# Patient Record
Sex: Female | Born: 1990 | Race: White | Hispanic: No | Marital: Married | Smoking: Never smoker
Health system: Southern US, Community
[De-identification: ages and names within clinical notes are randomized; demographics above are authoritative.]

## PROBLEM LIST (undated history)

## (undated) DIAGNOSIS — T7840XA Allergy, unspecified, initial encounter: Secondary | ICD-10-CM

## (undated) DIAGNOSIS — F419 Anxiety disorder, unspecified: Secondary | ICD-10-CM

## (undated) DIAGNOSIS — J45909 Unspecified asthma, uncomplicated: Secondary | ICD-10-CM

## (undated) HISTORY — DX: Unspecified asthma, uncomplicated: J45.909

## (undated) HISTORY — DX: Allergy, unspecified, initial encounter: T78.40XA

## (undated) HISTORY — DX: Anxiety disorder, unspecified: F41.9

---
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
## 2013-10-04 ENCOUNTER — Ambulatory Visit (INDEPENDENT_AMBULATORY_CARE_PROVIDER_SITE_OTHER): Payer: BC Managed Care – PPO | Admitting: Internal Medicine

## 2013-10-04 VITALS — BP 120/72 | HR 69 | Temp 98.1°F | Resp 18 | Ht 65.0 in | Wt 176.0 lb

## 2013-10-04 DIAGNOSIS — Z6829 Body mass index (BMI) 29.0-29.9, adult: Secondary | ICD-10-CM

## 2013-10-04 DIAGNOSIS — M549 Dorsalgia, unspecified: Secondary | ICD-10-CM

## 2013-10-04 LAB — POCT URINALYSIS DIPSTICK
Bilirubin, UA: NEGATIVE
Ketones, UA: NEGATIVE
Leukocytes, UA: NEGATIVE
Protein, UA: NEGATIVE
pH, UA: 7

## 2013-10-04 LAB — POCT UA - MICROSCOPIC ONLY
Crystals, Ur, HPF, POC: NEGATIVE
Mucus, UA: NEGATIVE
Yeast, UA: NEGATIVE

## 2013-10-04 MED ORDER — CYCLOBENZAPRINE HCL 10 MG PO TABS: 10.0000 mg | ORAL_TABLET | Freq: Every day | ORAL | Status: DC

## 2013-10-04 MED ORDER — MELOXICAM 15 MG PO TABS: 15.0000 mg | ORAL_TABLET | Freq: Every day | ORAL | Status: DC

## 2013-10-04 NOTE — Progress Notes (Addendum)
This chart was scribed for Tonye Pearson, MD by Caryn Bee, Medical Scribe. This patient was seen in Room/bed 14 and the patient's care was started at 11:48 AM.  Subjective:    Patient ID: Patricia Phelps, female    DOB: June 06, 1991, 22 y.o.   MRN: [id number]  HPI HPI Comments: Patricia Phelps is a 22 y.o. female who presents to [facility] complaining of sudden onset, moderate left sided back pain that began 09/28/13. Pt reports that she has pain when bending over and crossing her legs. The pain occasionally radiates to her left side. She reports waking multiple times during the night because of pain. She denies dysuria, increased frequency, hematuria, abdominal pain, constipation, diarrhea, nausea. Pt denies numbness or weakness in her left foot. Pt also denies recent injury to the area. She does not play any sports. 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 Pt's sister has h/o kidney stones. Pt takes Prilosec for indigestion for the past 5-7 years. Her LNMP was 09/29/13.    Review of Systems  Gastrointestinal: Negative for nausea, abdominal pain, diarrhea and constipation.  Genitourinary: Negative for dysuria, urgency, frequency and hematuria.  Musculoskeletal: Positive for back pain.  Neurological: Negative for weakness and numbness.   Past Medical History  Diagnosis Date   Allergy    Asthma    Anxiety    History   Social History   Marital Status: Married    Spouse Name: N/A    Number of Children: N/A   Years of Education: N/A   Occupational History   Not on file.   Social History Main Topics   Smoking status: Never Smoker    Smokeless tobacco: Not on file   Alcohol Use: Not on file   Drug Use: Not on file   Sexual Activity: Not on file   Other Topics Concern   Not on file   Social History Narrative   No narrative on file   History reviewed. No pertinent past surgical history. History reviewed. No pertinent family history. No Known Allergies  Results for orders placed  in visit on 10/04/13  POCT UA - MICROSCOPIC ONLY      Result Value Range   WBC, Ur, HPF, POC 0-1     RBC, urine, microscopic 0-1     Bacteria, U Microscopic trace     Mucus, UA neg     Epithelial cells, urine per micros 3-5     Crystals, Ur, HPF, POC neg     Casts, Ur, LPF, POC neg     Yeast, UA neg    POCT URINALYSIS DIPSTICK      Result Value Range   Color, UA yellow     Clarity, UA clear     Glucose, UA neg     Bilirubin, UA neg     Ketones, UA neg     Spec Grav, UA 1.025     Blood, UA trace-intact     pH, UA 7.0     Protein, UA neg     Urobilinogen, UA 0.2     Nitrite, UA neg     Leukocytes, UA Negative        Objective:   Physical Exam  Nursing note and vitals reviewed. Constitutional: She appears well-developed and well-nourished. No distress.  Cardiovascular: Normal rate.   Pulmonary/Chest: Effort normal.  Musculoskeletal: She exhibits tenderness.  Tender in left flank to ROM and to percussion. Straight leg raise negative to 90 degrees bilaterally.  Neurological: She displays normal reflexes.  Skin:  She is not diaphoretic.          Assessment & Plan:  Back pain -musculoskeletal  BMI 29.0-29.9,adult  Meds ordered this encounter  Medications          cyclobenzaprine (FLEXERIL) 10 MG tablet    Sig: Take 1 tablet (10 mg total) by mouth at bedtime. For muscle relaxation    Dispense:  30 tablet    Refill:  0   meloxicam (MOBIC) 15 MG tablet    Sig: Take 1 tablet (15 mg total) by mouth daily.    Dispense:  30 tablet    Refill:  0   Exercises given/followup in 3 weeks

## 2013-10-05 DIAGNOSIS — Z6829 Body mass index (BMI) 29.0-29.9, adult: Secondary | ICD-10-CM | POA: Insufficient documentation

## 2013-12-04 ENCOUNTER — Encounter (HOSPITAL_COMMUNITY): Payer: Self-pay | Admitting: Emergency Medicine

## 2013-12-04 ENCOUNTER — Emergency Department (INDEPENDENT_AMBULATORY_CARE_PROVIDER_SITE_OTHER)
Admission: EM | Admit: 2013-12-04 | Discharge: 2013-12-04 | Disposition: A | Discharge status: Home / Self Care | Payer: BC Managed Care – PPO | Source: Home / Self Care | Attending: Emergency Medicine | Admitting: Emergency Medicine

## 2013-12-04 DIAGNOSIS — J45909 Unspecified asthma, uncomplicated: Secondary | ICD-10-CM

## 2013-12-04 DIAGNOSIS — J111 Influenza due to unidentified influenza virus with other respiratory manifestations: Secondary | ICD-10-CM

## 2013-12-04 MED ORDER — PREDNISONE 10 MG PO TABS: ORAL_TABLET | ORAL | Status: DC

## 2013-12-04 MED ORDER — ALBUTEROL SULFATE HFA 108 (90 BASE) MCG/ACT IN AERS
INHALATION_SPRAY | RESPIRATORY_TRACT | Status: AC
  Filled 2013-12-04: qty 6.7

## 2013-12-04 MED ORDER — ALBUTEROL SULFATE HFA 108 (90 BASE) MCG/ACT IN AERS
2.0000 | INHALATION_SPRAY | RESPIRATORY_TRACT | Status: DC
  Administered 2013-12-04: 2 via RESPIRATORY_TRACT

## 2013-12-04 MED ORDER — OSELTAMIVIR PHOSPHATE 75 MG PO CAPS: 75.0000 mg | ORAL_CAPSULE | Freq: Two times a day (BID) | ORAL | Status: DC

## 2013-12-04 MED ORDER — ALBUTEROL SULFATE HFA 108 (90 BASE) MCG/ACT IN AERS: 1.0000 | INHALATION_SPRAY | Freq: Four times a day (QID) | RESPIRATORY_TRACT | Status: DC | PRN

## 2013-12-04 NOTE — ED Notes (Signed)
Pt  Reports  Symptoms  Of  Congested  Cough   And     Fever      Since  Yesterday        -    The   Patient        Has  History  Of  Asthma   Her inhalers  Are  Empty                 She  Has  A  Low  Grade  Fever       She  Did  Not take  Her flu  Shot this  Year

## 2013-12-04 NOTE — ED Provider Notes (Signed)
CSN: [id number]     Arrival date & time 12/04/13  1806 History   First MD Initiated Contact with Patient 12/04/13 1857     Chief Complaint  Patient presents with  . URI   (Consider location/radiation/quality/duration/timing/severity/associated sxs/prior Treatment) Patient is a 22 y.o. female presenting with URI. The history is provided by the patient. No language interpreter was used.  URI Presenting symptoms: congestion, cough and fever   Severity:  Moderate Onset quality:  Gradual Timing:  Constant Progression:  Worsening Chronicity:  New Relieved by:  Nothing Worsened by:  Nothing tried Ineffective treatments:  None tried Risk factors: no sick contacts     Past Medical History  Diagnosis Date  . Allergy   . Asthma   . Anxiety    History reviewed. No pertinent past surgical history. History reviewed. No pertinent family history. History  Substance Use Topics  . Smoking status: Never Smoker   . Smokeless tobacco: Not on file  . Alcohol Use: Yes   OB History   Grav Para Term Preterm Abortions TAB SAB Ect Mult Living                 Review of Systems  Constitutional: Positive for fever.  HENT: Positive for congestion.   Respiratory: Positive for cough.   All other systems reviewed and are negative.    Allergies  Review of patient's allergies indicates no known allergies.  Home Medications   Current Outpatient Rx  Name  Route  Sig  Dispense  Refill  . albuterol (PROVENTIL HFA;VENTOLIN HFA) 108 (90 BASE) MCG/ACT inhaler   Inhalation   Inhale 1-2 puffs into the lungs every 6 (six) hours as needed for wheezing or shortness of breath.   1 Inhaler   0   . cyclobenzaprine (FLEXERIL) 10 MG tablet   Oral   Take 1 tablet (10 mg total) by mouth at bedtime. For muscle relaxation   30 tablet   0   . meloxicam (MOBIC) 15 MG tablet   Oral   Take 1 tablet (15 mg total) by mouth daily.   30 tablet   0   . omeprazole (PRILOSEC OTC) 20 MG tablet   Oral   Take  20 mg by mouth daily.         Marland Kitchen oseltamivir (TAMIFLU) 75 MG capsule   Oral   Take 1 capsule (75 mg total) by mouth every 12 (twelve) hours.   10 capsule   0   . predniSONE (DELTASONE) 10 MG tablet      6,5,4,3,2,1 taper   21 tablet   0    BP 124/84  Pulse 106  Temp(Src) 100.2 F (37.9 C) (Oral)  Resp 19  SpO2 95%  LMP 11/30/2013 Physical Exam  Nursing note and vitals reviewed. Constitutional: She is oriented to person, place, and time. She appears well-developed and well-nourished.  HENT:  Head: Normocephalic.  Right Ear: External ear normal.  Left Ear: External ear normal.  Eyes: Conjunctivae and EOM are normal. Pupils are equal, round, and reactive to light.  Neck: Normal range of motion.  Pulmonary/Chest: Effort normal. She has wheezes.  Abdominal: Soft. She exhibits no distension.  Musculoskeletal: Normal range of motion.  Neurological: She is alert and oriented to person, place, and time.  Skin: Skin is warm.  Psychiatric: She has a normal mood and affect.    ED Course  Procedures (including critical care time) Labs Review Labs Reviewed - No data to display Imaging Review No results found.  EKG Interpretation    Date/Time:    Ventricular Rate:    PR Interval:    QRS Duration:   QT Interval:    QTC Calculation:   R Axis:     Text Interpretation:              MDM   1. Influenza   2. Asthma    Pt given albuterol 2 puffs here.   Rx for tamiflu, albuterol and prednisone taper,      Elson Areas, PA-C 12/04/13 2001  Lonia Skinner [place], [place] 12/04/13 2002

## 2013-12-05 NOTE — ED Provider Notes (Signed)
Medical screening examination/treatment/procedure(s) were performed by a resident physician or non-physician practitioner and as the supervising physician I was immediately available for consultation/collaboration.  Evan Corey, MD    Evan S Corey, MD 12/05/13 0740 

## 2014-11-03 ENCOUNTER — Encounter (HOSPITAL_COMMUNITY): Payer: Self-pay | Admitting: *Deleted

## 2014-11-03 ENCOUNTER — Emergency Department (HOSPITAL_COMMUNITY)
Admission: EM | Admit: 2014-11-03 | Discharge: 2014-11-03 | Disposition: A | Discharge status: Home / Self Care | Payer: BC Managed Care – PPO | Attending: Emergency Medicine | Admitting: Emergency Medicine

## 2014-11-03 DIAGNOSIS — Z791 Long term (current) use of non-steroidal anti-inflammatories (NSAID): Secondary | ICD-10-CM | POA: Insufficient documentation

## 2014-11-03 DIAGNOSIS — Z8659 Personal history of other mental and behavioral disorders: Secondary | ICD-10-CM | POA: Insufficient documentation

## 2014-11-03 DIAGNOSIS — J45901 Unspecified asthma with (acute) exacerbation: Secondary | ICD-10-CM | POA: Insufficient documentation

## 2014-11-03 DIAGNOSIS — J029 Acute pharyngitis, unspecified: Secondary | ICD-10-CM | POA: Insufficient documentation

## 2014-11-03 DIAGNOSIS — Z79899 Other long term (current) drug therapy: Secondary | ICD-10-CM | POA: Insufficient documentation

## 2014-11-03 MED ORDER — ALBUTEROL SULFATE HFA 108 (90 BASE) MCG/ACT IN AERS
2.0000 | INHALATION_SPRAY | Freq: Once | RESPIRATORY_TRACT | Status: AC
  Administered 2014-11-03: 2 via RESPIRATORY_TRACT
  Filled 2014-11-03: qty 6.7

## 2014-11-03 MED ORDER — IPRATROPIUM-ALBUTEROL 0.5-2.5 (3) MG/3ML IN SOLN
3.0000 mL | Freq: Once | RESPIRATORY_TRACT | Status: AC
  Administered 2014-11-03: 3 mL via RESPIRATORY_TRACT
  Filled 2014-11-03: qty 3

## 2014-11-03 NOTE — ED Notes (Signed)
Pt c/o asthma attack starting 4 hours ago. Pt had an inhaler; however cannot afford to refill prescription for inhaler. Has been using OTC medications, steam, and deep breathing exercises without relief. Expiratory wheezes noted

## 2014-11-03 NOTE — ED Provider Notes (Signed)
CSN: [id number]     Arrival date & time 11/03/14  2216 History  This chart was scribed for non-physician practitioner working with Flint MelterElliott L Wentz, MD by Elveria Risingimelie Horne, ED Scribe. This patient was seen in room TR09C/TR09C and the patient's care was started at 10:47 PM.   Chief Complaint  Patient presents with  . Asthma   The history is provided by the patient. No language interpreter was used.   HPI Comments: Patricia Phelps is a 23 y.o. female with history of asthma who presents to the Emergency Department complaining of intermittent asthma exacerbation for one month now. Patient reports exhausting inhaler four months ago. Patient reports sensation that she is suffocating with lying down. Patient reports historical treatment with albuterol and Qvar which proved effective. 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 Patient denies recent cold symptoms. No previous admissions for asthma.   Past Medical History  Diagnosis Date  . Allergy   . Asthma   . Anxiety    History reviewed. No pertinent past surgical history. History reviewed. No pertinent family history. History  Substance Use Topics  . Smoking status: Never Smoker   . Smokeless tobacco: Not on file  . Alcohol Use: Yes   OB History    No data available     Review of Systems  Constitutional: Negative for fever, chills and fatigue.  HENT: Positive for sore throat. Negative for congestion, ear pain, rhinorrhea and sinus pressure.   Eyes: Negative for redness.  Respiratory: Positive for cough and wheezing.   Gastrointestinal: Negative for nausea, vomiting, abdominal pain and diarrhea.  Genitourinary: Negative for dysuria.  Musculoskeletal: Negative for myalgias and neck stiffness.  Skin: Negative for rash.  Neurological: Negative for headaches.  Hematological: Negative for adenopathy.      Allergies  Review of patient's allergies indicates no known  allergies.  Home Medications   Prior to Admission medications   Medication Sig Start Date End Date Taking? Authorizing Provider  albuterol (PROVENTIL HFA;VENTOLIN HFA) 108 (90 BASE) MCG/ACT inhaler Inhale 1-2 puffs into the lungs every 6 (six) hours as needed for wheezing or shortness of breath. 12/04/13   Elson AreasLeslie K Sofia, PA-C  cyclobenzaprine (FLEXERIL) 10 MG tablet Take 1 tablet (10 mg total) by mouth at bedtime. For muscle relaxation 10/04/13   Tonye Pearsonobert P Doolittle, MD  meloxicam (MOBIC) 15 MG tablet Take 1 tablet (15 mg total) by mouth daily. 10/04/13   Tonye Pearsonobert P Doolittle, MD  omeprazole (PRILOSEC OTC) 20 MG tablet Take 20 mg by mouth daily.    Historical Provider, MD  oseltamivir (TAMIFLU) 75 MG capsule Take 1 capsule (75 mg total) by mouth every 12 (twelve) hours. 12/04/13   Elson AreasLeslie K Sofia, PA-C  predniSONE (DELTASONE) 10 MG tablet 6,5,4,3,2,1 taper 12/04/13   Elson AreasLeslie K Sofia, PA-C   Triage Vitals: BP 120/75 mmHg  Pulse 89  Temp(Src) 98.3 F (36.8 C) (Oral)  Resp 22  SpO2 99% Physical Exam  Constitutional: She is oriented to person, place, and time. She appears well-developed and well-nourished. No distress.  HENT:  Head: Normocephalic and atraumatic.  Right Ear: Tympanic membrane, external ear and ear canal normal.  Left Ear: Tympanic membrane, external ear and ear canal normal.  Nose: Nose normal. No mucosal edema or rhinorrhea.  Mouth/Throat: Uvula is midline, oropharynx is clear and moist and mucous membranes are normal. Mucous membranes are not dry. No oral lesions. No trismus in the jaw. No uvula swelling.  No oropharyngeal exudate, posterior oropharyngeal edema, posterior oropharyngeal erythema or tonsillar abscesses.  Eyes: Conjunctivae and EOM are normal. Right eye exhibits no discharge. Left eye exhibits no discharge.  Neck: Normal range of motion. Neck supple. No tracheal deviation present.  Cardiovascular: Normal rate, regular rhythm and normal heart sounds.    Pulmonary/Chest: Effort normal and breath sounds normal. No respiratory distress. She has no wheezes. She has no rales.  No wheezing s/p breathing treatment.   Abdominal: Soft. There is no tenderness.  Musculoskeletal: Normal range of motion.  Lymphadenopathy:    She has no cervical adenopathy.  Neurological: She is alert and oriented to person, place, and time.  Skin: Skin is warm and dry.  Psychiatric: She has a normal mood and affect. Her behavior is normal.  Nursing note and vitals reviewed.   ED Course  Procedures (including critical care time)  COORDINATION OF CARE: 10:52 PM- Discussed treatment plan with patient at bedside and patient agreed to plan.   Labs Review Labs Reviewed - No data to display  Imaging Review No results found.   EKG Interpretation None      Vital signs reviewed and are as follows: Filed Vitals:   11/03/14 2220  BP: 120/75  Pulse: 89  Temp: 98.3 F (36.8 C)  Resp: 22   Patient with subjective and objective improvement after breathing treatment. Will discharge to home with albuterol inhaler. Offered prednisone, patient does not think that she needs this. No recent URI symptoms, fever or other concerns. Discharged home.  MDM   Final diagnoses:  Asthma attack   Patient with poorly controlled asthma due to medication noncompliance. Patient much improved after one breathing treatment here. No underlying URI. Do not suspect pneumonia. Patient appears well, nontoxic.  I personally performed the services described in this documentation, which was scribed in my presence. The recorded information has been reviewed and is accurate.    Renne CriglerJoshua Poseidon Pam, PA-C 11/03/14 2300  Flint MelterElliott L Wentz, MD 11/03/14 [phone]

## 2014-11-03 NOTE — ED Notes (Signed)
Pt in c/o asthma attack, states she ran out of her inhaler a few months ago but hadn't needed it, tonight started wheezing and coughing, pt speaking in full sentences, audible wheezing noted

## 2014-11-03 NOTE — Discharge Instructions (Signed)
Please read and follow all provided instructions.  Your diagnoses today include:  1. Asthma attack    Tests performed today include:  Vital signs. See below for your results today.   Medications prescribed:   Albuterol inhaler - medication that opens up your airway  Use inhaler as follows: 1-2 puffs with spacer every 4 hours as needed for wheezing, cough, or shortness of breath.   Take any prescribed medications only as directed.  Home care instructions:  Follow any educational materials contained in this packet.  Follow-up instructions: Please follow-up with your primary care provider in the next 3 days for further evaluation of your symptoms and management of your asthma.  Return instructions:   Please return to the Emergency Department if you experience worsening symptoms.  Please return with worsening wheezing, shortness of breath, or difficulty breathing.  Return with persistent fever above 101F.   Please return if you have any other emergent concerns.  Additional Information:  Your vital signs today were: BP 120/75 mmHg   Pulse 89   Temp(Src) 98.3 F (36.8 C) (Oral)   Resp 22   SpO2 99% If your blood pressure (BP) was elevated above 135/85 this visit, please have this repeated by your doctor within one month. --------------

## 2015-12-25 ENCOUNTER — Emergency Department (HOSPITAL_COMMUNITY)
Admission: EM | Admit: 2015-12-25 | Discharge: 2015-12-25 | Disposition: A | Discharge status: Home / Self Care | Payer: Self-pay | Attending: Emergency Medicine | Admitting: Emergency Medicine

## 2015-12-25 ENCOUNTER — Encounter (HOSPITAL_COMMUNITY): Payer: Self-pay | Admitting: Emergency Medicine

## 2015-12-25 ENCOUNTER — Emergency Department (HOSPITAL_COMMUNITY): Payer: Self-pay

## 2015-12-25 DIAGNOSIS — Z79899 Other long term (current) drug therapy: Secondary | ICD-10-CM | POA: Insufficient documentation

## 2015-12-25 DIAGNOSIS — J209 Acute bronchitis, unspecified: Secondary | ICD-10-CM

## 2015-12-25 DIAGNOSIS — Z8659 Personal history of other mental and behavioral disorders: Secondary | ICD-10-CM | POA: Insufficient documentation

## 2015-12-25 DIAGNOSIS — J45901 Unspecified asthma with (acute) exacerbation: Secondary | ICD-10-CM | POA: Insufficient documentation

## 2015-12-25 DIAGNOSIS — H7493 Unspecified disorder of middle ear and mastoid, bilateral: Secondary | ICD-10-CM | POA: Insufficient documentation

## 2015-12-25 DIAGNOSIS — Z791 Long term (current) use of non-steroidal anti-inflammatories (NSAID): Secondary | ICD-10-CM | POA: Insufficient documentation

## 2015-12-25 LAB — RAPID STREP SCREEN (MED CTR MEBANE ONLY): Streptococcus, Group A Screen (Direct): NEGATIVE

## 2015-12-25 MED ORDER — BENZONATATE 100 MG PO CAPS: 100.0000 mg | ORAL_CAPSULE | Freq: Three times a day (TID) | ORAL | Status: DC | PRN

## 2015-12-25 MED ORDER — CETIRIZINE HCL 10 MG PO TABS: 10.0000 mg | ORAL_TABLET | Freq: Every day | ORAL | Status: AC

## 2015-12-25 MED ORDER — FLUTICASONE PROPIONATE 50 MCG/ACT NA SUSP: 2.0000 | Freq: Every day | NASAL | Status: AC

## 2015-12-25 MED ORDER — NAPROXEN 250 MG PO TABS: 250.0000 mg | ORAL_TABLET | Freq: Two times a day (BID) | ORAL | Status: DC

## 2015-12-25 MED ORDER — ALBUTEROL SULFATE HFA 108 (90 BASE) MCG/ACT IN AERS: 2.0000 | INHALATION_SPRAY | RESPIRATORY_TRACT | Status: AC | PRN

## 2015-12-25 MED ORDER — ALBUTEROL SULFATE HFA 108 (90 BASE) MCG/ACT IN AERS
2.0000 | INHALATION_SPRAY | Freq: Once | RESPIRATORY_TRACT | Status: AC
  Administered 2015-12-25: 2 via RESPIRATORY_TRACT
  Filled 2015-12-25: qty 6.7

## 2015-12-25 MED ORDER — IPRATROPIUM-ALBUTEROL 0.5-2.5 (3) MG/3ML IN SOLN
3.0000 mL | Freq: Once | RESPIRATORY_TRACT | Status: AC
  Administered 2015-12-25: 3 mL via RESPIRATORY_TRACT
  Filled 2015-12-25: qty 3

## 2015-12-25 MED ORDER — PREDNISONE 20 MG PO TABS: 40.0000 mg | ORAL_TABLET | Freq: Every day | ORAL | Status: DC

## 2015-12-25 NOTE — Discharge Instructions (Signed)

## 2015-12-25 NOTE — ED Provider Notes (Signed)
CSN: [id number]     Arrival date & time 12/25/15  1106 History  By signing my name below, I, Freida Busman, attest that this documentation has been prepared under the direction and in the presence of non-physician practitioner, Everlene Farrier, PA-C. Electronically Signed: Freida Busman, Scribe. 12/25/2015. 1:17 PM.    Chief Complaint  Patient presents with  . Cough  . Nasal Congestion    The history is provided by the patient. No language interpreter was used.     HPI Comments:  Patricia Phelps is a 25 y.o. female with a history of asthma, who presents to the Emergency Department complaining of productive cough with green sputum X 1 week. She reports associated wheezing, chest tightness, ear pain and sore throat, chest tightness. Pt notes subjective fever 2 days ago, none today. She has taken tylenol with mild relief of pain, last dose was ~ 0600 this AM. She has also used her inhaler but she is almost out and in need of a refill.  She denies CP, rashes, nausea, vomiting and diarrhea. No alleviating factors noted.    Past Medical History  Diagnosis Date  . Allergy   . Asthma   . Anxiety    History reviewed. No pertinent past surgical history. History reviewed. No pertinent family history. Social History  Substance Use Topics  . Smoking status: Never Smoker   . Smokeless tobacco: None  . Alcohol Use: Yes   OB History    No data available     Review of Systems  Constitutional: Positive for fever (subjective). Negative for chills.  HENT: Positive for ear pain, postnasal drip, rhinorrhea, sneezing and sore throat. Negative for trouble swallowing and voice change.   Eyes: Negative for visual disturbance.  Respiratory: Positive for cough, chest tightness and wheezing. Negative for shortness of breath.   Cardiovascular: Negative for chest pain.  Gastrointestinal: Negative for nausea, vomiting and abdominal pain.  Musculoskeletal: Positive for myalgias (chest and neck soreness).   Skin: Negative for rash and wound.  Neurological: Negative for dizziness, light-headedness and headaches.    Allergies  Review of patient's allergies indicates no known allergies.  Home Medications   Prior to Admission medications   Medication Sig Start Date End Date Taking? Authorizing Provider  albuterol (PROVENTIL HFA;VENTOLIN HFA) 108 (90 Base) MCG/ACT inhaler Inhale 2 puffs into the lungs every 4 (four) hours as needed for wheezing or shortness of breath. 12/25/15   Everlene Farrier, PA-C  benzonatate (TESSALON) 100 MG capsule Take 1 capsule (100 mg total) by mouth 3 (three) times daily as needed for cough. 12/25/15   Everlene Farrier, PA-C  cetirizine (ZYRTEC ALLERGY) 10 MG tablet Take 1 tablet (10 mg total) by mouth daily. 12/25/15   Everlene Farrier, PA-C  cyclobenzaprine (FLEXERIL) 10 MG tablet Take 1 tablet (10 mg total) by mouth at bedtime. For muscle relaxation 10/04/13   Tonye Pearson, MD  fluticasone [facility]) 50 MCG/ACT nasal spray Place 2 sprays into both nostrils daily. 12/25/15   Everlene Farrier, PA-C  meloxicam (MOBIC) 15 MG tablet Take 1 tablet (15 mg total) by mouth daily. 10/04/13   Tonye Pearson, MD  naproxen (NAPROSYN) 250 MG tablet Take 1 tablet (250 mg total) by mouth 2 (two) times daily with a meal. 12/25/15   Everlene Farrier, PA-C  omeprazole (PRILOSEC OTC) 20 MG tablet Take 20 mg by mouth daily.    Historical Provider, MD  oseltamivir (TAMIFLU) 75 MG capsule Take 1 capsule (75 mg total) by mouth every 12 (twelve)  hours. 12/04/13   Elson Areas, PA-C  predniSONE (DELTASONE) 20 MG tablet Take 2 tablets (40 mg total) by mouth daily. 12/25/15   Everlene Farrier, PA-C   BP 119/77 mmHg  Pulse 73  Temp(Src) 98.9 F (37.2 C) (Oral)  Resp 18  SpO2 98% Physical Exam  Constitutional: She is oriented to person, place, and time. She appears well-developed and well-nourished. No distress.  Nontoxic appearing.  HENT:  Head: Normocephalic and atraumatic.  Right Ear:  External ear normal. Tympanic membrane is not erythematous. A middle ear effusion is present.  Left Ear: External ear normal. Tympanic membrane is not erythematous. A middle ear effusion is present.  Mouth/Throat: Uvula is midline. Posterior oropharyngeal erythema present. No oropharyngeal exudate.  Mild middle ear effusion bilaterally; no erythema Boggy nasal turbinates  Mild tonsillar hypertrophy; no exudates. Uvula is midline without edema. No trismus. No drooling. No peritonsillar abscess.  Eyes: Conjunctivae are normal. Pupils are equal, round, and reactive to light. Right eye exhibits no discharge. Left eye exhibits no discharge.  Neck: Normal range of motion. Neck supple. No JVD present. No tracheal deviation present.  Cardiovascular: Normal rate, regular rhythm, normal heart sounds and intact distal pulses.  Exam reveals no gallop and no friction rub.   No murmur heard. Pulmonary/Chest: Effort normal. No respiratory distress. She has wheezes. She has no rales. She exhibits no tenderness.  scattered wheezes bilaterally.  Abdominal: Soft. Bowel sounds are normal. There is no tenderness. There is no guarding.  Musculoskeletal: She exhibits no edema.  Lymphadenopathy:    She has no cervical adenopathy.  Neurological: She is alert and oriented to person, place, and time. Coordination normal.  Skin: Skin is warm and dry. No rash noted. She is not diaphoretic. No erythema. No pallor.  Psychiatric: She has a normal mood and affect. Her behavior is normal.  Nursing note and vitals reviewed.   ED Course  Procedures   DIAGNOSTIC STUDIES:  Oxygen Saturation is 98% on RA, normal by my interpretation.    COORDINATION OF CARE:  12:40 PM Will order rapid strep and CXR. Discussed treatment plan with pt at bedside and pt agreed to plan.  Labs Review Labs Reviewed  RAPID STREP SCREEN (NOT AT [facility])  CULTURE, GROUP A STREP [facility])    Imaging Review Dg Chest 2 View  12/25/2015  CLINICAL  DATA:  Shortness of breath and cough EXAM: CHEST  2 VIEW COMPARISON:  None. FINDINGS: Lungs are clear. Heart size and pulmonary vascularity are normal. No adenopathy. No bone lesions. IMPRESSION: No edema or consolidation. Electronically Signed   By: Bretta Bang III M.D.   On: 12/25/2015 13:55   I have personally reviewed and evaluated these images and lab results as part of my medical decision-making.   EKG Interpretation None      Filed Vitals:   12/25/15 1133  BP: 119/77  Pulse: 73  Temp: 98.9 F (37.2 C)  TempSrc: Oral  Resp: 18  SpO2: 98%     MDM   Meds given in ED:  Medications  albuterol (PROVENTIL HFA;VENTOLIN HFA) 108 (90 Base) MCG/ACT inhaler 2 puff (not administered)  ipratropium-albuterol (DUONEB) 0.5-2.5 (3) MG/3ML nebulizer solution 3 mL (3 mLs Nebulization Given 12/25/15 1319)    New Prescriptions   ALBUTEROL (PROVENTIL HFA;VENTOLIN HFA) 108 (90 BASE) MCG/ACT INHALER    Inhale 2 puffs into the lungs every 4 (four) hours as needed for wheezing or shortness of breath.   BENZONATATE (TESSALON) 100 MG CAPSULE  Take 1 capsule (100 mg total) by mouth 3 (three) times daily as needed for cough.   CETIRIZINE (ZYRTEC ALLERGY) 10 MG TABLET    Take 1 tablet (10 mg total) by mouth daily.   FLUTICASONE (FLONASE) 50 MCG/ACT NASAL SPRAY    Place 2 sprays into both nostrils daily.   NAPROXEN (NAPROSYN) 250 MG TABLET    Take 1 tablet (250 mg total) by mouth 2 (two) times daily with a meal.   PREDNISONE (DELTASONE) 20 MG TABLET    Take 2 tablets (40 mg total) by mouth daily.    Final diagnoses:  Acute bronchitis, unspecified organism   This is a 25 y.o. female with a history of asthma, who presents to the Emergency Department complaining of productive cough with green sputum X 1 week. She reports associated wheezing, chest tightness, ear pain and sore throat, chest tightness. On exam the patient is afebrile nontoxic appearing. She has mild scattered wheezes noted  bilaterally. No increased work of breathing. Oxygen saturation is 98% on room air. She has mild bilateral tonsillar hypertrophy without exudates. Mild middle ear effusion noted bilaterally. Chest x-ray is unremarkable. Rapid strep is negative. Patient with bronchitis. After DuoNeb patient reports feeling much better. We'll discharge with albuterol inhaler. Will send home with prescriptions for Tessalon Perles, Zyrtec, Flonase, naproxen and steroid. I advised the patient to follow-up with their primary care provider this week. I advised the patient to return to the emergency department with new or worsening symptoms or new concerns. The patient verbalized understanding and agreement with plan.    I personally performed the services described in this documentation, which was scribed in my presence. The recorded information has been reviewed and is accurate.       Everlene Farrier, PA-C 12/25/15 1426  Mancel Bale, MD 12/25/15 [phone]

## 2015-12-25 NOTE — ED Notes (Signed)
Pt sts cough and nasal congestion x 1 week with sore throat and ear pain

## 2015-12-27 LAB — CULTURE, GROUP A STREP (THRC)

## 2016-05-16 ENCOUNTER — Ambulatory Visit (INDEPENDENT_AMBULATORY_CARE_PROVIDER_SITE_OTHER): Payer: Worker's Compensation | Admitting: Family Medicine

## 2016-05-16 VITALS — BP 106/68 | HR 75 | Temp 98.9°F | Resp 16 | Ht 65.5 in | Wt 169.0 lb

## 2016-05-16 DIAGNOSIS — T148XXA Other injury of unspecified body region, initial encounter: Secondary | ICD-10-CM

## 2016-05-16 DIAGNOSIS — T148 Other injury of unspecified body region: Secondary | ICD-10-CM

## 2016-05-16 DIAGNOSIS — S060X0A Concussion without loss of consciousness, initial encounter: Secondary | ICD-10-CM

## 2016-05-16 MED ORDER — CYCLOBENZAPRINE HCL 10 MG PO TABS: 10.0000 mg | ORAL_TABLET | Freq: Every day | ORAL | Status: AC

## 2016-05-16 MED ORDER — NAPROXEN 500 MG PO TABS: 500.0000 mg | ORAL_TABLET | Freq: Two times a day (BID) | ORAL | Status: AC

## 2016-05-16 NOTE — Progress Notes (Signed)
Patricia Phelps is a 25 y.o. female who presents to [facility] today for head injury. Patient was at work today and slipped and fell on stairs hitting the posterior aspect of her head. She denies any loss of consciousness but notes headache and felt foggy initially. She also notes some posterior neck pain. She denies any radiating pain weakness or numbness. No bowel bladder dysfunction fevers chills nausea vomiting or diarrhea. She feels well otherwise. She has not tried any medications yet.     Past Medical History  Diagnosis Date  . Allergy   . Asthma   . Anxiety    History reviewed. No pertinent past surgical history. Social History  Substance Use Topics  . Smoking status: Never Smoker   . Smokeless tobacco: Not on file  . Alcohol Use: Yes   ROS as above No , visual changes, nausea, vomiting, diarrhea, constipation, dizziness, abdominal pain, skin rash, fevers, chills, night sweats, weight loss, swollen lymph nodes, body aches, joint swelling, muscle aches, chest pain, shortness of breath, mood changes, visual or auditory hallucinations.   Medications: Current Outpatient Prescriptions  Medication Sig Dispense Refill  . albuterol (PROVENTIL HFA;VENTOLIN HFA) 108 (90 Base) MCG/ACT inhaler Inhale 2 puffs into the lungs every 4 (four) hours as needed for wheezing or shortness of breath. 1 Inhaler 1  . cetirizine (ZYRTEC ALLERGY) 10 MG tablet Take 1 tablet (10 mg total) by mouth daily. 30 tablet 1  . fluticasone (FLONASE) 50 MCG/ACT nasal spray Place 2 sprays into both nostrils daily. 16 g 0  . omeprazole (PRILOSEC OTC) 20 MG tablet Take 20 mg by mouth daily.    . cyclobenzaprine (FLEXERIL) 10 MG tablet Take 1 tablet (10 mg total) by mouth at bedtime. For muscle relaxation 30 tablet 0  . naproxen (NAPROSYN) 500 MG tablet Take 1 tablet (500 mg total) by mouth 2 (two) times daily with a meal. 30 tablet 0  . oseltamivir (TAMIFLU) 75 MG capsule Take 1 capsule (75 mg total) by mouth every 12  (twelve) hours. (Patient not taking: Reported on 05/16/2016) 10 capsule 0  . predniSONE (DELTASONE) 20 MG tablet Take 2 tablets (40 mg total) by mouth daily. (Patient not taking: Reported on 05/16/2016) 10 tablet 0   No current facility-administered medications for this visit.   No Known Allergies   Exam:  BP 106/68 mmHg  Pulse 75  Temp(Src) 98.9 F (37.2 C)  Resp 16  Ht 5' 5.5" (1.664 m)  Wt 169 lb (76.658 kg)  BMI 27.69 kg/m2  SpO2 98%  LMP 04/22/2016 Gen: Well NAD Nontoxic appearing HEENT: EOMI,  MMM Lungs: Normal work of breathing. CTABL Heart: RRR no MRG Abd: NABS, Soft. Nondistended, Nontender Exts: Brisk capillary refill, warm and well perfused.  Neck: Nontender to midline. Tender palpation bilateral cervical paraspinal muscles. Normal neck motion or pain with motion. Negative Spurling's test bilaterally. Upper extremity strength is equal and normal throughout. Reflexes are intact bilateral upper extremities. Pulses intact bilateral upper extremity. Neuro: Alert and oriented normal speech thought process and affect. Normal balance and coordination. Normal memory and attention.  No results found for this or any previous visit (from the past 24 hour(s)). No results found.  Assessment and Plan: 25 y.o. female with head injury due to fall at work. Concern for concussion. Additionally she has a contusion of the posterior spine area and doubtful for fracture or serious intracranial etiology. Plan for watchful waiting with naproxen and Flexeril. Abstain from work and perform cognitive rest. Return to  clinic on Monday for recheck and reevaluation for a return to work assessment.  Discussed warning signs or symptoms. Please see discharge instructions. Patient expresses understanding.

## 2016-05-16 NOTE — Patient Instructions (Addendum)
Thank you for coming in today. Use naproxen twice daily for pain and headache.  Use flexeril mostly at bedtime for muscle spasm.  Use a heating pad.  Return Monday for recheck.  Come back or go to the emergency room if you notice new weakness new numbness problems walking or bowel or bladder problems. Go to the emergency room if your headache becomes excruciating or you have weakness or numbness or uncontrolled vomiting.   Concussion, Adult A concussion, or closed-head injury, is a brain injury caused by a direct blow to the head or by a quick and sudden movement (jolt) of the head or neck. Concussions are usually not life-threatening. Even so, the effects of a concussion can be serious. If you have had a concussion before, you are more likely to experience concussion-like symptoms after a direct blow to the head.  CAUSES  Direct blow to the head, such as from running into another player during a soccer game, being hit in a fight, or hitting your head on a hard surface.  A jolt of the head or neck that causes the brain to move back and forth inside the skull, such as in a car crash. SIGNS AND SYMPTOMS The signs of a concussion can be hard to notice. Early on, they may be missed by you, family members, and health care providers. You may look fine but act or feel differently. Symptoms are usually temporary, but they may last for days, weeks, or even longer. Some symptoms may appear right away while others may not show up for hours or days. Every head injury is different. Symptoms include:  Mild to moderate headaches that will not go away.  A feeling of pressure inside your head.  Having more trouble than usual:  Learning or remembering things you have heard.  Answering questions.  Paying attention or concentrating.  Organizing daily tasks.  Making decisions and solving problems.  Slowness in thinking, acting or reacting, speaking, or reading.  Getting lost or being easily  confused.  Feeling tired all the time or lacking energy (fatigued).  Feeling drowsy.  Sleep disturbances.  Sleeping more than usual.  Sleeping less than usual.  Trouble falling asleep.  Trouble sleeping (insomnia).  Loss of balance or feeling lightheaded or dizzy.  Nausea or vomiting.  Numbness or tingling.  Increased sensitivity to:  Sounds.  Lights.  Distractions.  Vision problems or eyes that tire easily.  Diminished sense of taste or smell.  Ringing in the ears.  Mood changes such as feeling sad or anxious.  Becoming easily irritated or angry for little or no reason.  Lack of motivation.  Seeing or hearing things other people do not see or hear (hallucinations). DIAGNOSIS Your health care provider can usually diagnose a concussion based on a description of your injury and symptoms. He or she will ask whether you passed out (lost consciousness) and whether you are having trouble remembering events that happened right before and during your injury. Your evaluation might include:  A brain scan to look for signs of injury to the brain. Even if the test shows no injury, you may still have a concussion.  Blood tests to be sure other problems are not present. TREATMENT  Concussions are usually treated in an emergency department, in urgent care, or at a clinic. You may need to stay in the hospital overnight for further treatment.  Tell your health care provider if you are taking any medicines, including prescription medicines, over-the-counter medicines, and natural remedies. Some  medicines, such as blood thinners (anticoagulants) and aspirin, may increase the chance of complications. Also tell your health care provider whether you have had alcohol or are taking illegal drugs. This information may affect treatment.  Your health care provider will send you home with important instructions to follow.  How fast you will recover from a concussion depends on many  factors. These factors include how severe your concussion is, what part of your brain was injured, your age, and how healthy you were before the concussion.  Most people with mild injuries recover fully. Recovery can take time. In general, recovery is slower in older persons. Also, persons who have had a concussion in the past or have other medical problems may find that it takes longer to recover from their current injury. HOME CARE INSTRUCTIONS General Instructions  Carefully follow the directions your health care provider gave you.  Only take over-the-counter or prescription medicines for pain, discomfort, or fever as directed by your health care provider.  Take only those medicines that your health care provider has approved.  Do not drink alcohol until your health care provider says you are well enough to do so. Alcohol and certain other drugs may slow your recovery and can put you at risk of further injury.  If it is harder than usual to remember things, write them down.  If you are easily distracted, try to do one thing at a time. For example, do not try to watch TV while fixing dinner.  Talk with family members or close friends when making important decisions.  Keep all follow-up appointments. Repeated evaluation of your symptoms is recommended for your recovery.  Watch your symptoms and tell others to do the same. Complications sometimes occur after a concussion. Older adults with a brain injury may have a higher risk of serious complications, such as a blood clot on the brain.  Tell your teachers, school nurse, school counselor, coach, athletic trainer, or work Production designer, theatre/television/film about your injury, symptoms, and restrictions. Tell them about what you can or cannot do. They should watch for:  Increased problems with attention or concentration.  Increased difficulty remembering or learning new information.  Increased time needed to complete tasks or assignments.  Increased irritability  or decreased ability to cope with stress.  Increased symptoms.  Rest. Rest helps the brain to heal. Make sure you:  Get plenty of sleep at night. Avoid staying up late at night.  Keep the same bedtime hours on weekends and weekdays.  Rest during the day. Take daytime naps or rest breaks when you feel tired.  Limit activities that require a lot of thought or concentration. These include:  Doing homework or job-related work.  Watching TV.  Working on the computer.  Avoid any situation where there is potential for another head injury (football, hockey, soccer, basketball, martial arts, downhill snow sports and horseback riding). Your condition will get worse every time you experience a concussion. You should avoid these activities until you are evaluated by the appropriate follow-up health care providers. Returning To Your Regular Activities You will need to return to your normal activities slowly, not all at once. You must give your body and brain enough time for recovery.  Do not return to sports or other athletic activities until your health care provider tells you it is safe to do so.  Ask your health care provider when you can drive, ride a bicycle, or operate heavy machinery. Your ability to react may be slower after a brain  injury. Never do these activities if you are dizzy.  Ask your health care provider about when you can return to work or school. Preventing Another Concussion It is very important to avoid another brain injury, especially before you have recovered. In rare cases, another injury can lead to permanent brain damage, brain swelling, or death. The risk of this is greatest during the first 7-10 days after a head injury. Avoid injuries by:  Wearing a seat belt when riding in a car.  Drinking alcohol only in moderation.  Wearing a helmet when biking, skiing, skateboarding, skating, or doing similar activities.  Avoiding activities that could lead to a second  concussion, such as contact or recreational sports, until your health care provider says it is okay.  Taking safety measures in your home.  Remove clutter and tripping hazards from floors and stairways.  Use grab bars in bathrooms and handrails by stairs.  Place non-slip mats on floors and in bathtubs.  Improve lighting in dim areas. SEEK MEDICAL CARE IF:  You have increased problems paying attention or concentrating.  You have increased difficulty remembering or learning new information.  You need more time to complete tasks or assignments than before.  You have increased irritability or decreased ability to cope with stress.  You have more symptoms than before. Seek medical care if you have any of the following symptoms for more than 2 weeks after your injury:  Lasting (chronic) headaches.  Dizziness or balance problems.  Nausea.  Vision problems.  Increased sensitivity to noise or light.  Depression or mood swings.  Anxiety or irritability.  Memory problems.  Difficulty concentrating or paying attention.  Sleep problems.  Feeling tired all the time. SEEK IMMEDIATE MEDICAL CARE IF:  You have severe or worsening headaches. These may be a sign of a blood clot in the brain.  You have weakness (even if only in one hand, leg, or part of the face).  You have numbness.  You have decreased coordination.  You vomit repeatedly.  You have increased sleepiness.  One pupil is larger than the other.  You have convulsions.  You have slurred speech.  You have increased confusion. This may be a sign of a blood clot in the brain.  You have increased restlessness, agitation, or irritability.  You are unable to recognize people or places.  You have neck pain.  It is difficult to wake you up.  You have unusual behavior changes.  You lose consciousness. MAKE SURE YOU:  Understand these instructions.  Will watch your condition.  Will get help right away  if you are not doing well or get worse.   This information is not intended to replace advice given to you by your health care provider. Make sure you discuss any questions you have with your health care provider.   Document Released: 02/14/2004 Document Revised: 12/15/2014 Document Reviewed: 06/16/2013 Elsevier Interactive Patient Education 2016 [employer]     IF you received an x-ray today, you will receive an invoice from [facility]. Please contact [facility] at [phone] with questions or concerns regarding your invoice.   IF you received labwork today, you will receive an invoice from [facility]. Please contact [facility] at [phone] with questions or concerns regarding your invoice.   Our billing staff will not be able to assist you with questions regarding bills from these companies.  You will be contacted with the lab results as soon as they are available. The fastest way to get  your results is to activate your My Chart account. Instructions are located on the last page of this paperwork. If you have not heard from [place] regarding the results in 2 weeks, please contact this office.

## 2016-05-19 ENCOUNTER — Ambulatory Visit (INDEPENDENT_AMBULATORY_CARE_PROVIDER_SITE_OTHER): Payer: Worker's Compensation | Admitting: Family Medicine

## 2016-05-19 VITALS — BP 118/78 | HR 89 | Temp 98.3°F | Resp 16 | Ht 65.5 in | Wt 176.6 lb

## 2016-05-19 DIAGNOSIS — M791 Myalgia, unspecified site: Secondary | ICD-10-CM

## 2016-05-19 DIAGNOSIS — S060X0D Concussion without loss of consciousness, subsequent encounter: Secondary | ICD-10-CM

## 2016-05-19 NOTE — Progress Notes (Signed)
Subjective:  By signing my name below, I, Patricia Phelps, attest that this documentation has been prepared under the direction and in the presence of Patricia Staggers, MD.  Electronically Signed: Andrew Phelps, ED Scribe. 05/19/2016. 3:18 PM.   Patient ID: Patricia Phelps, female    DOB: Nov 25, 1991, 25 y.o.   MRN: [id number]  HPI Chief Complaint  Patient presents with  . Follow-up    W/C concussion follow up , headache only when bending or moving head too fast     HPI Comments: Patricia Phelps is a 25 y.o. female who presents to the [facility] follow up from an injury that occurred at work. Pt was seen 3 days ago by Dr. Denyse Amass. She slipped and fell on stairs at work, hitting the poster head on steps. No LOC. Some initial headache and fogginess. Some posterior neck pain, but no midline bony tenderness.  Diagnosed with poss concussion and contusion of spine. Treated with flexeril and naproxen. Out of work for cognitive rest. Here for recheck.   Today, pt reports feeling somewhat "slow with reaction time". No headache at rest but does have headache with certain movements such as bending forward. She notes swelling has resolved at posterior head as well as soreness. Overall she feels 85-90% improved since last visit. No further injuries. She has been resting. Pt has been taking flexeril at night as well as tylenol as needed instead of naproxen prescribed. She denies bladder and bowel incontinence, numbness and weakness of the extremities.   Pt works for [employer].   Patient Active Problem List   Diagnosis Date Noted  . Concussion with no loss of consciousness 05/16/2016  . Contusion 05/16/2016  . BMI 29.0-29.9,adult 10/05/2013   Past Medical History  Diagnosis Date  . Allergy   . Asthma   . Anxiety    No past surgical history on file. No Known Allergies Prior to Admission medications   Medication Sig Start Date End Date Taking? Authorizing Provider  albuterol  (PROVENTIL HFA;VENTOLIN HFA) 108 (90 Base) MCG/ACT inhaler Inhale 2 puffs into the lungs every 4 (four) hours as needed for wheezing or shortness of breath. 12/25/15  Yes Everlene Farrier, PA-C  cetirizine (ZYRTEC ALLERGY) 10 MG tablet Take 1 tablet (10 mg total) by mouth daily. 12/25/15  Yes Everlene Farrier, PA-C  cyclobenzaprine (FLEXERIL) 10 MG tablet Take 1 tablet (10 mg total) by mouth at bedtime. For muscle relaxation 05/16/16  Yes Rodolph Bong, MD  fluticasone [facility]) 50 MCG/ACT nasal spray Place 2 sprays into both nostrils daily. 12/25/15  Yes Everlene Farrier, PA-C  naproxen (NAPROSYN) 500 MG tablet Take 1 tablet (500 mg total) by mouth 2 (two) times daily with a meal. 05/16/16  Yes Rodolph Bong, MD  omeprazole (PRILOSEC OTC) 20 MG tablet Take 20 mg by mouth daily.   Yes Historical Provider, MD   Social History   Social History  . Marital Status: Married    Spouse Name: N/A  . Number of Children: N/A  . Years of Education: N/A   Occupational History  . Not on file.   Social History Main Topics  . Smoking status: Never Smoker   . Smokeless tobacco: Not on file  . Alcohol Use: Yes  . Drug Use: Not on file  . Sexual Activity: Not on file   Other Topics Concern  . Not on file   Social History Narrative   Review of Systems  Gastrointestinal: Negative for diarrhea and constipation.  Genitourinary:  Negative for urgency, enuresis and difficulty urinating.  Neurological: Positive for headaches (with movement). Negative for weakness and numbness.    Objective:   Physical Exam  Constitutional: She is oriented to person, place, and time. She appears well-developed and well-nourished. No distress.  HENT:  Head: Normocephalic and atraumatic.  Eyes: Conjunctivae and EOM are normal. Pupils are equal, round, and reactive to light. Right eye exhibits no nystagmus. Left eye exhibits no nystagmus.  Normal convergence.   Neck: Neck supple. No muscular tenderness present.  Cardiovascular: Normal  rate.   Pulmonary/Chest: Effort normal.  Musculoskeletal: Normal range of motion.  Neurological: She is alert and oriented to person, place, and time.  Slight headache with rapid eye movement. Approximately 7-8 touch down on 1 legged balance testing.   Skin: Skin is warm and dry.  Psychiatric: She has a normal mood and affect. Her behavior is normal.  Nursing note and vitals reviewed.  Filed Vitals:   05/19/16 1431  BP: 118/78  Pulse: 89  Temp: 98.3 F (36.8 C)  TempSrc: Oral  Resp: 16  Height: 5' 5.5" (1.664 m)  Weight: 176 lb 9.6 oz (80.105 kg)  SpO2: 99%    Assessment & Plan:  Patricia Phelps is a 25 y.o. female Concussion with no loss of consciousness, subsequent encounter  Myalgia Concussion, improving. 80-90% improved. Some residual headache with certain activities. Will provide 2 more days out of work for cognitive and physical rest, then anticipate possible trial of return to activity. Tylenol as needed. RTC precautions.    No orders of the defined types were placed in this encounter.   Patient Instructions       IF you received an x-ray today, you will receive an invoice from [facility]. Please contact [facility] at [phone] with questions or concerns regarding your invoice.   IF you received labwork today, you will receive an invoice from [facility]. Please contact [facility] at [phone] with questions or concerns regarding your invoice.   Our billing staff will not be able to assist you with questions regarding bills from these companies.  You will be contacted with the lab results as soon as they are available. The fastest way to get your results is to activate your My Chart account. Instructions are located on the last page of this paperwork. If you have not heard from us regarding the results in 2 weeks, please contact this office.     Continue rest and out of work through Wednesday. Recheck with me on  Wednesday. Also try to avoid electronic media as much as possible in this time. As your other body aches have improved, you can probably stop the muscle relaxant at night, as it also can cause some fatigue. Tylenol or Naprosyn that was discussed last visit is okay for episodic soreness in the body.   Return to the clinic or go to the nearest emergency room if any of your symptoms worsen or new symptoms occur.  Concussion, Adult A concussion, or closed-head injury, is a brain injury caused by a direct blow to the head or by a quick and sudden movement (jolt) of the head or neck. Concussions are usually not life-threatening. Even so, the effects of a concussion can be serious. If you have had a concussion before, you are more likely to experience concussion-like symptoms after a direct blow to the head.  CAUSES  Direct blow to the head, such as from running into another player during a soccer game, being hit  in a fight, or hitting your head on a hard surface.  A jolt of the head or neck that causes the brain to move back and forth inside the skull, such as in a car crash. SIGNS AND SYMPTOMS The signs of a concussion can be hard to notice. Early on, they may be missed by you, family members, and health care providers. You may look fine but act or feel differently. Symptoms are usually temporary, but they may last for days, weeks, or even longer. Some symptoms may appear right away while others may not show up for hours or days. Every head injury is different. Symptoms include:  Mild to moderate headaches that will not go away.  A feeling of pressure inside your head.  Having more trouble than usual:  Learning or remembering things you have heard.  Answering questions.  Paying attention or concentrating.  Organizing daily tasks.  Making decisions and solving problems.  Slowness in thinking, acting or reacting, speaking, or reading.  Getting lost or being easily confused.  Feeling tired  all the time or lacking energy (fatigued).  Feeling drowsy.  Sleep disturbances.  Sleeping more than usual.  Sleeping less than usual.  Trouble falling asleep.  Trouble sleeping (insomnia).  Loss of balance or feeling lightheaded or dizzy.  Nausea or vomiting.  Numbness or tingling.  Increased sensitivity to:  Sounds.  Lights.  Distractions.  Vision problems or eyes that tire easily.  Diminished sense of taste or smell.  Ringing in the ears.  Mood changes such as feeling sad or anxious.  Becoming easily irritated or angry for little or no reason.  Lack of motivation.  Seeing or hearing things other people do not see or hear (hallucinations). DIAGNOSIS Your health care provider can usually diagnose a concussion based on a description of your injury and symptoms. He or she will ask whether you passed out (lost consciousness) and whether you are having trouble remembering events that happened right before and during your injury. Your evaluation might include:  A brain scan to look for signs of injury to the brain. Even if the test shows no injury, you may still have a concussion.  Blood tests to be sure other problems are not present. TREATMENT  Concussions are usually treated in an emergency department, in urgent care, or at a clinic. You may need to stay in the hospital overnight for further treatment.  Tell your health care provider if you are taking any medicines, including prescription medicines, over-the-counter medicines, and natural remedies. Some medicines, such as blood thinners (anticoagulants) and aspirin, may increase the chance of complications. Also tell your health care provider whether you have had alcohol or are taking illegal drugs. This information may affect treatment.  Your health care provider will send you home with important instructions to follow.  How fast you will recover from a concussion depends on many factors. These factors include  how severe your concussion is, what part of your brain was injured, your age, and how healthy you were before the concussion.  Most people with mild injuries recover fully. Recovery can take time. In general, recovery is slower in older persons. Also, persons who have had a concussion in the past or have other medical problems may find that it takes longer to recover from their current injury. HOME CARE INSTRUCTIONS General Instructions  Carefully follow the directions your health care provider gave you.  Only take over-the-counter or prescription medicines for pain, discomfort, or fever as directed by your  health care provider.  Take only those medicines that your health care provider has approved.  Do not drink alcohol until your health care provider says you are well enough to do so. Alcohol and certain other drugs may slow your recovery and can put you at risk of further injury.  If it is harder than usual to remember things, write them down.  If you are easily distracted, try to do one thing at a time. For example, do not try to watch TV while fixing dinner.  Talk with family members or close friends when making important decisions.  Keep all follow-up appointments. Repeated evaluation of your symptoms is recommended for your recovery.  Watch your symptoms and tell others to do the same. Complications sometimes occur after a concussion. Older adults with a brain injury may have a higher risk of serious complications, such as a blood clot on the brain.  Tell your teachers, school nurse, school counselor, coach, athletic trainer, or work Production designer, theatre/television/film about your injury, symptoms, and restrictions. Tell them about what you can or cannot do. They should watch for:  Increased problems with attention or concentration.  Increased difficulty remembering or learning new information.  Increased time needed to complete tasks or assignments.  Increased irritability or decreased ability to cope  with stress.  Increased symptoms.  Rest. Rest helps the brain to heal. Make sure you:  Get plenty of sleep at night. Avoid staying up late at night.  Keep the same bedtime hours on weekends and weekdays.  Rest during the day. Take daytime naps or rest breaks when you feel tired.  Limit activities that require a lot of thought or concentration. These include:  Doing homework or job-related work.  Watching TV.  Working on the computer.  Avoid any situation where there is potential for another head injury (football, hockey, soccer, basketball, martial arts, downhill snow sports and horseback riding). Your condition will get worse every time you experience a concussion. You should avoid these activities until you are evaluated by the appropriate follow-up health care providers. Returning To Your Regular Activities You will need to return to your normal activities slowly, not all at once. You must give your body and brain enough time for recovery.  Do not return to sports or other athletic activities until your health care provider tells you it is safe to do so.  Ask your health care provider when you can drive, ride a bicycle, or operate heavy machinery. Your ability to react may be slower after a brain injury. Never do these activities if you are dizzy.  Ask your health care provider about when you can return to work or school. Preventing Another Concussion It is very important to avoid another brain injury, especially before you have recovered. In rare cases, another injury can lead to permanent brain damage, brain swelling, or death. The risk of this is greatest during the first 7-10 days after a head injury. Avoid injuries by:  Wearing a seat belt when riding in a car.  Drinking alcohol only in moderation.  Wearing a helmet when biking, skiing, skateboarding, skating, or doing similar activities.  Avoiding activities that could lead to a second concussion, such as contact or  recreational sports, until your health care provider says it is okay.  Taking safety measures in your home.  Remove clutter and tripping hazards from floors and stairways.  Use grab bars in bathrooms and handrails by stairs.  Place non-slip mats on floors and in bathtubs.  Improve  lighting in dim areas. SEEK MEDICAL CARE IF:  You have increased problems paying attention or concentrating.  You have increased difficulty remembering or learning new information.  You need more time to complete tasks or assignments than before.  You have increased irritability or decreased ability to cope with stress.  You have more symptoms than before. Seek medical care if you have any of the following symptoms for more than 2 weeks after your injury:  Lasting (chronic) headaches.  Dizziness or balance problems.  Nausea.  Vision problems.  Increased sensitivity to noise or light.  Depression or mood swings.  Anxiety or irritability.  Memory problems.  Difficulty concentrating or paying attention.  Sleep problems.  Feeling tired all the time. SEEK IMMEDIATE MEDICAL CARE IF:  You have severe or worsening headaches. These may be a sign of a blood clot in the brain.  You have weakness (even if only in one hand, leg, or part of the face).  You have numbness.  You have decreased coordination.  You vomit repeatedly.  You have increased sleepiness.  One pupil is larger than the other.  You have convulsions.  You have slurred speech.  You have increased confusion. This may be a sign of a blood clot in the brain.  You have increased restlessness, agitation, or irritability.  You are unable to recognize people or places.  You have neck pain.  It is difficult to wake you up.  You have unusual behavior changes.  You lose consciousness. MAKE SURE YOU:  Understand these instructions.  Will watch your condition.  Will get help right away if you are not doing well or  get worse.   This information is not intended to replace advice given to you by your health care provider. Make sure you discuss any questions you have with your health care provider.   Document Released: 02/14/2004 Document Revised: 12/15/2014 Document Reviewed: 06/16/2013 [employer] Interactive Patient Education [employer]   I personally performed the services described in this documentation, which was scribed in my presence. The recorded information has been reviewed and considered, and addended by me as needed.   Signed,   Patricia Staggers, MD [facility].  05/19/2016 7:39 PM

## 2016-05-19 NOTE — Patient Instructions (Signed)
IF you received an x-ray today, you will receive an invoice from [facility]. Please contact [facility] at [phone] with questions or concerns regarding your invoice.   IF you received labwork today, you will receive an invoice from [facility]. Please contact [facility] at [phone] with questions or concerns regarding your invoice.   Our billing staff will not be able to assist you with questions regarding bills from these companies.  You will be contacted with the lab results as soon as they are available. The fastest way to get your results is to activate your My Chart account. Instructions are located on the last page of this paperwork. If you have not heard from us regarding the results in 2 weeks, please contact this office.     Continue rest and out of work through Wednesday. Recheck with me on Wednesday. Also try to avoid electronic media as much as possible in this time. As your other body aches have improved, you can probably stop the muscle relaxant at night, as it also can cause some fatigue. Tylenol or Naprosyn that was discussed last visit is okay for episodic soreness in the body.   Return to the clinic or go to the nearest emergency room if any of your symptoms worsen or new symptoms occur.  Concussion, Adult A concussion, or closed-head injury, is a brain injury caused by a direct blow to the head or by a quick and sudden movement (jolt) of the head or neck. Concussions are usually not life-threatening. Even so, the effects of a concussion can be serious. If you have had a concussion before, you are more likely to experience concussion-like symptoms after a direct blow to the head.  CAUSES  Direct blow to the head, such as from running into another player during a soccer game, being hit in a fight, or hitting your head on a hard surface.  A jolt of the head or neck that causes the brain to move back and forth inside the  skull, such as in a car crash. SIGNS AND SYMPTOMS The signs of a concussion can be hard to notice. Early on, they may be missed by you, family members, and health care providers. You may look fine but act or feel differently. Symptoms are usually temporary, but they may last for days, weeks, or even longer. Some symptoms may appear right away while others may not show up for hours or days. Every head injury is different. Symptoms include:  Mild to moderate headaches that will not go away.  A feeling of pressure inside your head.  Having more trouble than usual:  Learning or remembering things you have heard.  Answering questions.  Paying attention or concentrating.  Organizing daily tasks.  Making decisions and solving problems.  Slowness in thinking, acting or reacting, speaking, or reading.  Getting lost or being easily confused.  Feeling tired all the time or lacking energy (fatigued).  Feeling drowsy.  Sleep disturbances.  Sleeping more than usual.  Sleeping less than usual.  Trouble falling asleep.  Trouble sleeping (insomnia).  Loss of balance or feeling lightheaded or dizzy.  Nausea or vomiting.  Numbness or tingling.  Increased sensitivity to:  Sounds.  Lights.  Distractions.  Vision problems or eyes that tire easily.  Diminished sense of taste or smell.  Ringing in the ears.  Mood changes such as feeling sad or anxious.  Becoming easily irritated or angry for little or no reason.  Lack of motivation.  Seeing or hearing things other people do not see or hear (hallucinations). DIAGNOSIS Your health care provider can usually diagnose a concussion based on a description of your injury and symptoms. He or she will ask whether you passed out (lost consciousness) and whether you are having trouble remembering events that happened right before and during your injury. Your evaluation might include:  A brain scan to look for signs of injury to the  brain. Even if the test shows no injury, you may still have a concussion.  Blood tests to be sure other problems are not present. TREATMENT  Concussions are usually treated in an emergency department, in urgent care, or at a clinic. You may need to stay in the hospital overnight for further treatment.  Tell your health care provider if you are taking any medicines, including prescription medicines, over-the-counter medicines, and natural remedies. Some medicines, such as blood thinners (anticoagulants) and aspirin, may increase the chance of complications. Also tell your health care provider whether you have had alcohol or are taking illegal drugs. This information may affect treatment.  Your health care provider will send you home with important instructions to follow.  How fast you will recover from a concussion depends on many factors. These factors include how severe your concussion is, what part of your brain was injured, your age, and how healthy you were before the concussion.  Most people with mild injuries recover fully. Recovery can take time. In general, recovery is slower in older persons. Also, persons who have had a concussion in the past or have other medical problems may find that it takes longer to recover from their current injury. HOME CARE INSTRUCTIONS General Instructions  Carefully follow the directions your health care provider gave you.  Only take over-the-counter or prescription medicines for pain, discomfort, or fever as directed by your health care provider.  Take only those medicines that your health care provider has approved.  Do not drink alcohol until your health care provider says you are well enough to do so. Alcohol and certain other drugs may slow your recovery and can put you at risk of further injury.  If it is harder than usual to remember things, write them down.  If you are easily distracted, try to do one thing at a time. For example, do not try to  watch TV while fixing dinner.  Talk with family members or close friends when making important decisions.  Keep all follow-up appointments. Repeated evaluation of your symptoms is recommended for your recovery.  Watch your symptoms and tell others to do the same. Complications sometimes occur after a concussion. Older adults with a brain injury may have a higher risk of serious complications, such as a blood clot on the brain.  Tell your teachers, school nurse, school counselor, coach, athletic trainer, or work Production designer, theatre/television/film about your injury, symptoms, and restrictions. Tell them about what you can or cannot do. They should watch for:  Increased problems with attention or concentration.  Increased difficulty remembering or learning new information.  Increased time needed to complete tasks or assignments.  Increased irritability or decreased ability to cope with stress.  Increased symptoms.  Rest. Rest helps the brain to heal. Make sure you:  Get plenty of sleep at night. Avoid staying up late at night.  Keep the same bedtime hours on weekends and weekdays.  Rest during the day. Take daytime naps or rest breaks when you feel tired.  Limit activities that require a lot of thought  or concentration. These include:  Doing homework or job-related work.  Watching TV.  Working on the computer.  Avoid any situation where there is potential for another head injury (football, hockey, soccer, basketball, martial arts, downhill snow sports and horseback riding). Your condition will get worse every time you experience a concussion. You should avoid these activities until you are evaluated by the appropriate follow-up health care providers. Returning To Your Regular Activities You will need to return to your normal activities slowly, not all at once. You must give your body and brain enough time for recovery.  Do not return to sports or other athletic activities until your health care provider  tells you it is safe to do so.  Ask your health care provider when you can drive, ride a bicycle, or operate heavy machinery. Your ability to react may be slower after a brain injury. Never do these activities if you are dizzy.  Ask your health care provider about when you can return to work or school. Preventing Another Concussion It is very important to avoid another brain injury, especially before you have recovered. In rare cases, another injury can lead to permanent brain damage, brain swelling, or death. The risk of this is greatest during the first 7-10 days after a head injury. Avoid injuries by:  Wearing a seat belt when riding in a car.  Drinking alcohol only in moderation.  Wearing a helmet when biking, skiing, skateboarding, skating, or doing similar activities.  Avoiding activities that could lead to a second concussion, such as contact or recreational sports, until your health care provider says it is okay.  Taking safety measures in your home.  Remove clutter and tripping hazards from floors and stairways.  Use grab bars in bathrooms and handrails by stairs.  Place non-slip mats on floors and in bathtubs.  Improve lighting in dim areas. SEEK MEDICAL CARE IF:  You have increased problems paying attention or concentrating.  You have increased difficulty remembering or learning new information.  You need more time to complete tasks or assignments than before.  You have increased irritability or decreased ability to cope with stress.  You have more symptoms than before. Seek medical care if you have any of the following symptoms for more than 2 weeks after your injury:  Lasting (chronic) headaches.  Dizziness or balance problems.  Nausea.  Vision problems.  Increased sensitivity to noise or light.  Depression or mood swings.  Anxiety or irritability.  Memory problems.  Difficulty concentrating or paying attention.  Sleep problems.  Feeling tired  all the time. SEEK IMMEDIATE MEDICAL CARE IF:  You have severe or worsening headaches. These may be a sign of a blood clot in the brain.  You have weakness (even if only in one hand, leg, or part of the face).  You have numbness.  You have decreased coordination.  You vomit repeatedly.  You have increased sleepiness.  One pupil is larger than the other.  You have convulsions.  You have slurred speech.  You have increased confusion. This may be a sign of a blood clot in the brain.  You have increased restlessness, agitation, or irritability.  You are unable to recognize people or places.  You have neck pain.  It is difficult to wake you up.  You have unusual behavior changes.  You lose consciousness. MAKE SURE YOU:  Understand these instructions.  Will watch your condition.  Will get help right away if you are not doing well or get  worse.   This information is not intended to replace advice given to you by your health care provider. Make sure you discuss any questions you have with your health care provider.   Document Released: 02/14/2004 Document Revised: 12/15/2014 Document Reviewed: 06/16/2013 Elsevier Interactive Patient Education [employer]

## 2016-10-14 DIAGNOSIS — Z6829 Body mass index (BMI) 29.0-29.9, adult: Secondary | ICD-10-CM | POA: Diagnosis not present

## 2016-10-14 DIAGNOSIS — J4520 Mild intermittent asthma, uncomplicated: Secondary | ICD-10-CM | POA: Diagnosis not present

## 2016-10-14 DIAGNOSIS — F064 Anxiety disorder due to known physiological condition: Secondary | ICD-10-CM | POA: Diagnosis not present

## 2016-10-14 DIAGNOSIS — E6609 Other obesity due to excess calories: Secondary | ICD-10-CM | POA: Diagnosis not present

## 2016-11-04 DIAGNOSIS — E6609 Other obesity due to excess calories: Secondary | ICD-10-CM | POA: Diagnosis not present

## 2016-11-04 DIAGNOSIS — J4520 Mild intermittent asthma, uncomplicated: Secondary | ICD-10-CM | POA: Diagnosis not present

## 2016-11-04 DIAGNOSIS — F064 Anxiety disorder due to known physiological condition: Secondary | ICD-10-CM | POA: Diagnosis not present

## 2017-03-12 DIAGNOSIS — J4520 Mild intermittent asthma, uncomplicated: Secondary | ICD-10-CM | POA: Diagnosis not present

## 2017-03-12 DIAGNOSIS — E6609 Other obesity due to excess calories: Secondary | ICD-10-CM | POA: Diagnosis not present

## 2017-03-12 DIAGNOSIS — J09X2 Influenza due to identified novel influenza A virus with other respiratory manifestations: Secondary | ICD-10-CM | POA: Diagnosis not present

## 2017-05-16 IMAGING — DX DG CHEST 2V
2 series · 2 of 2 positions shown · non-contrast
Comparison: None.

CLINICAL DATA: Shortness of breath and cough

EXAM:
CHEST  2 VIEW

[chest pa]
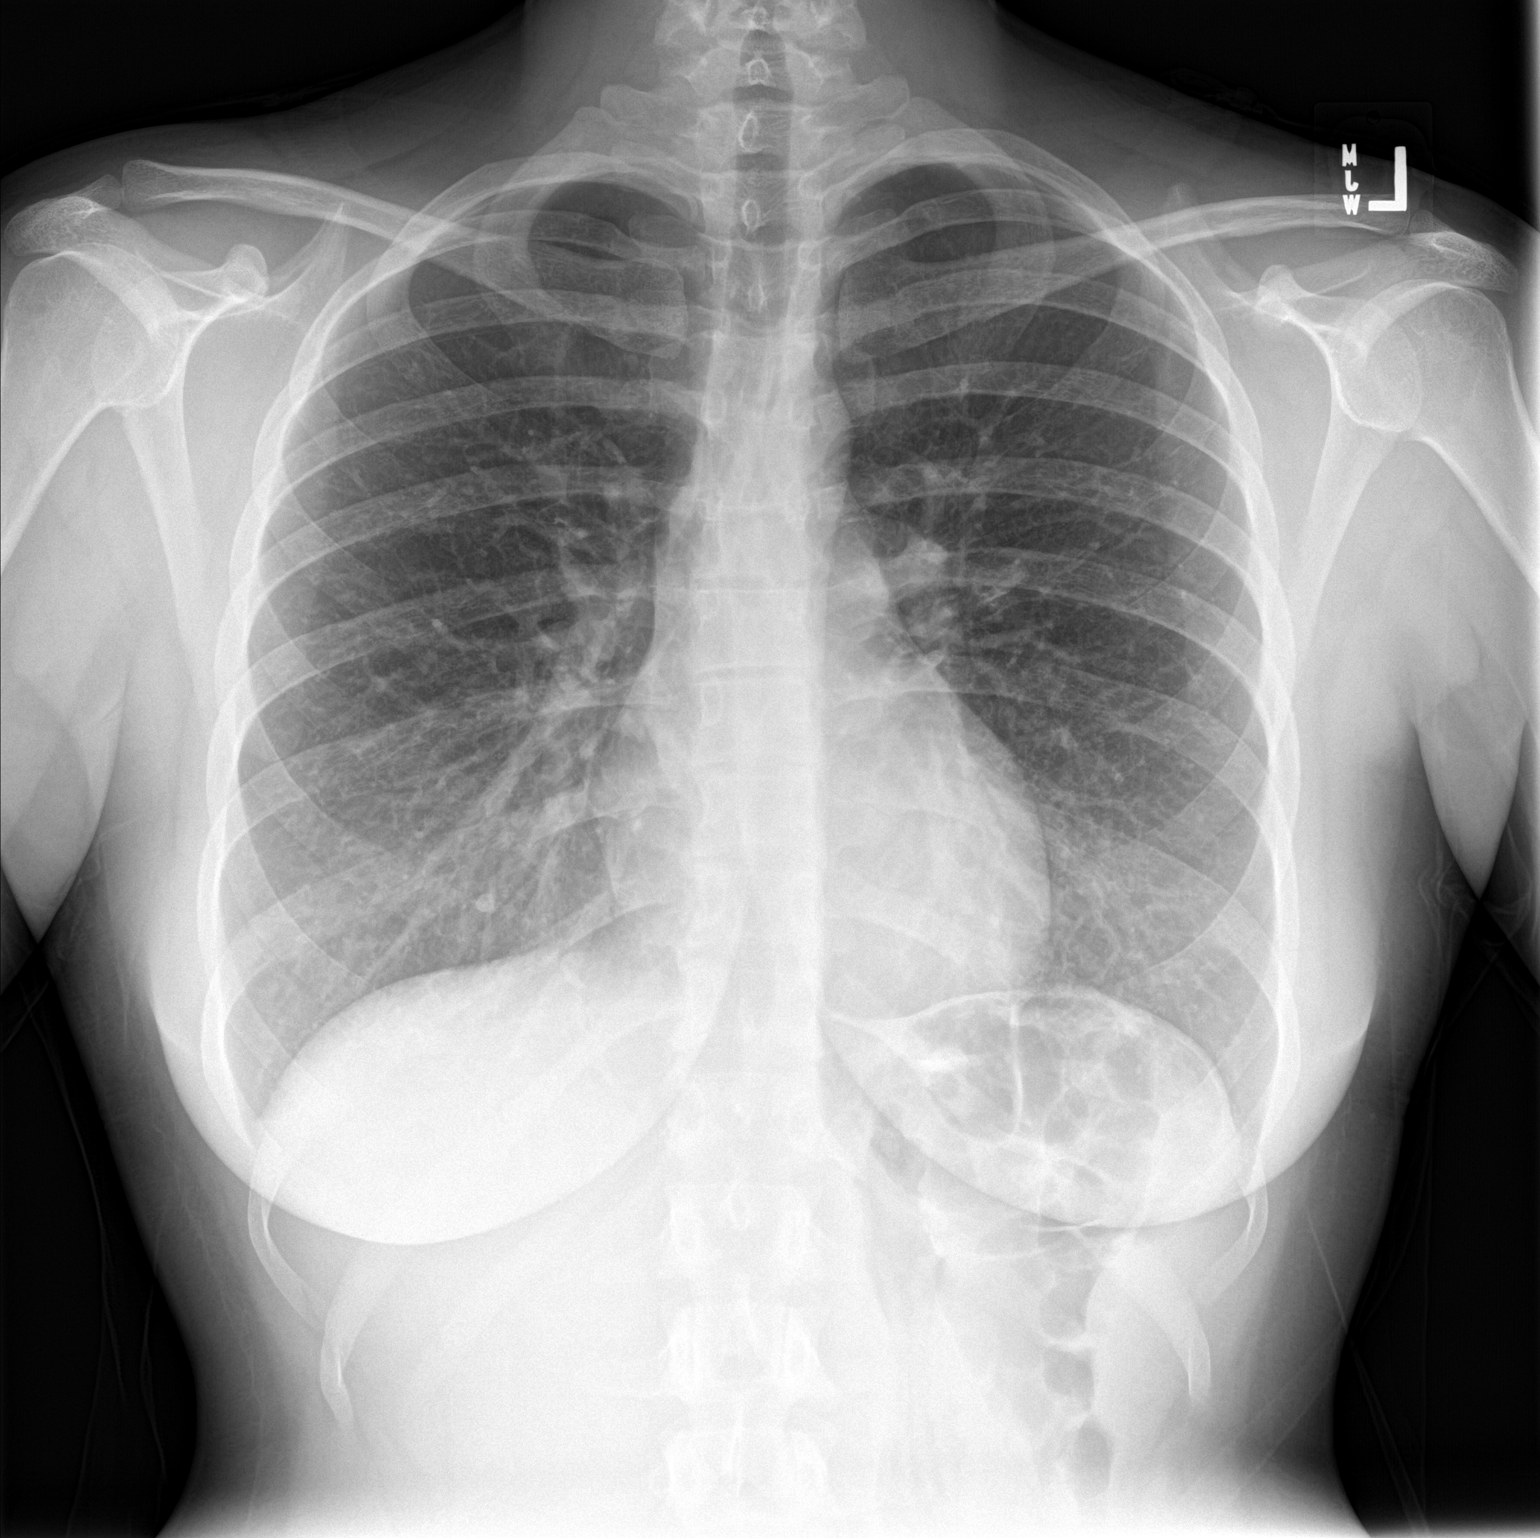

[chest lat]
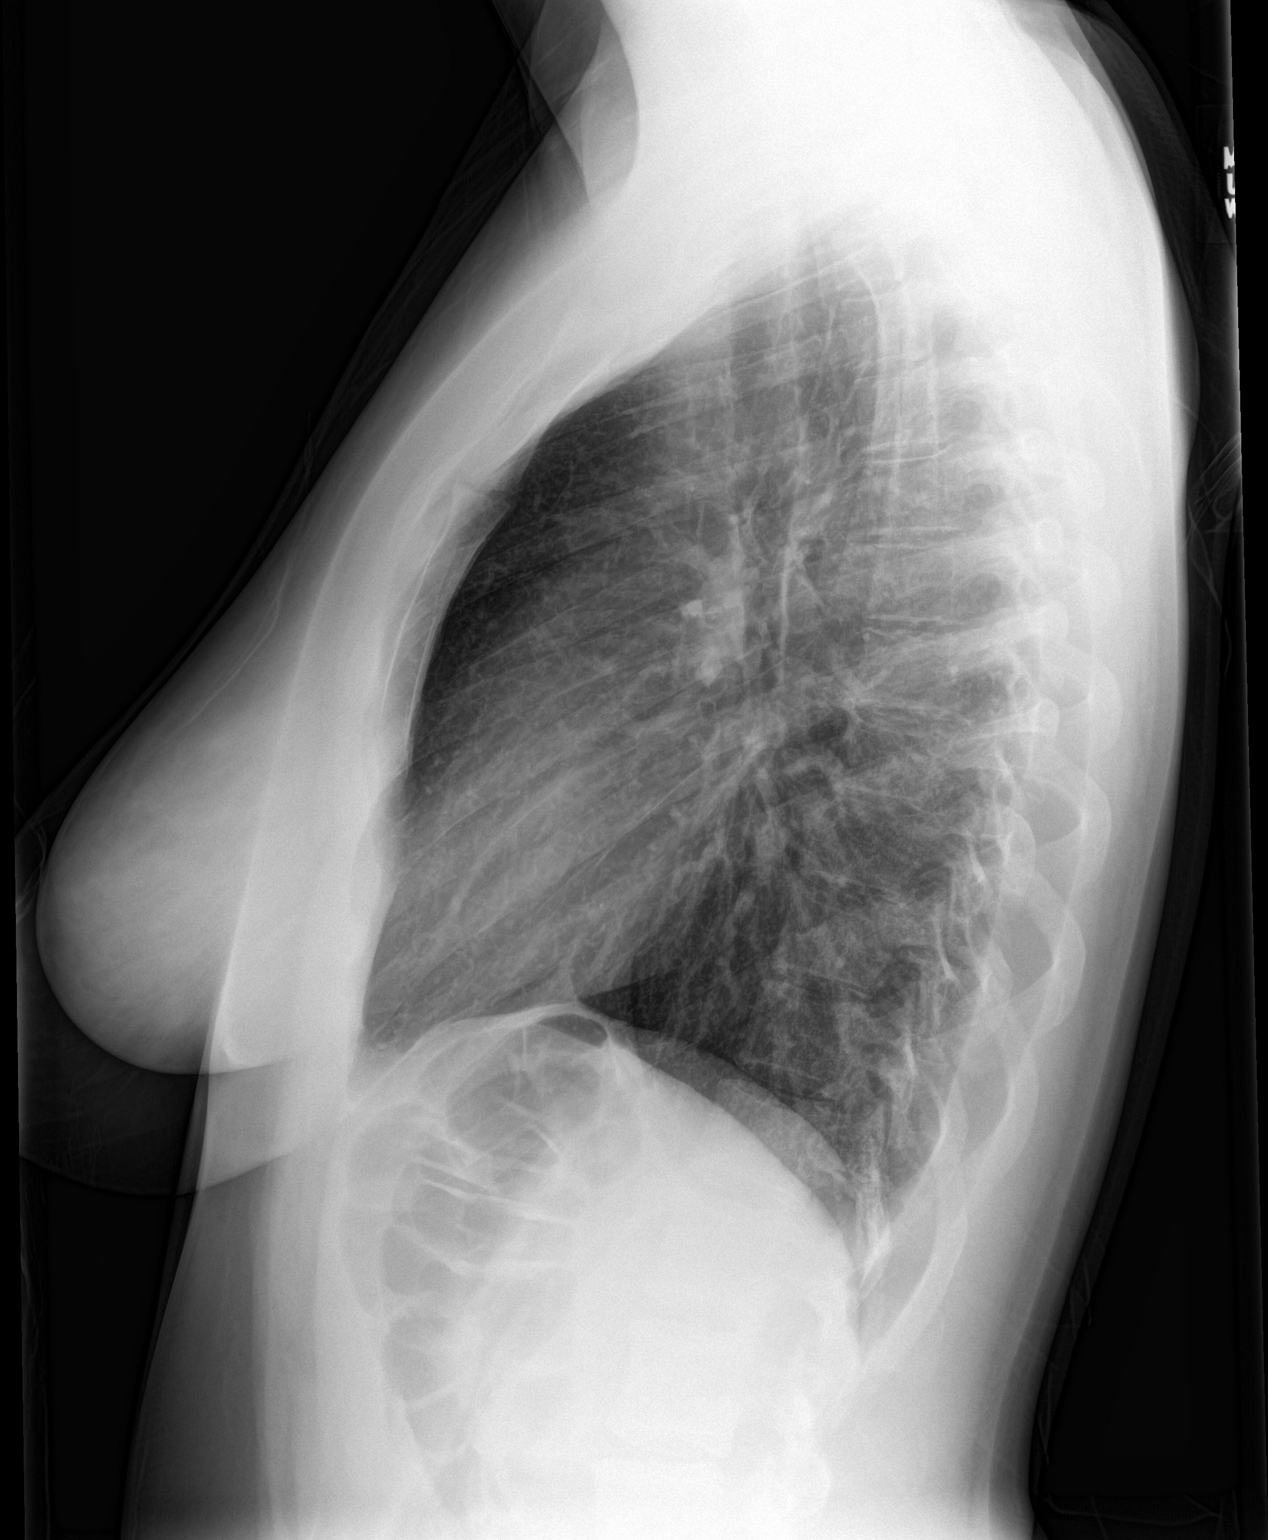

[2 of 2 positions shown; findings below may reference images not displayed]

FINDINGS: Lungs are clear. Heart size and pulmonary vascularity are normal. No
adenopathy. No bone lesions.
IMPRESSION: No edema or consolidation.

## 2017-05-19 DIAGNOSIS — E6609 Other obesity due to excess calories: Secondary | ICD-10-CM | POA: Diagnosis not present

## 2017-05-19 DIAGNOSIS — F900 Attention-deficit hyperactivity disorder, predominantly inattentive type: Secondary | ICD-10-CM | POA: Diagnosis not present

## 2018-10-14 DIAGNOSIS — N920 Excessive and frequent menstruation with regular cycle: Secondary | ICD-10-CM | POA: Diagnosis not present

## 2018-11-01 DIAGNOSIS — R319 Hematuria, unspecified: Secondary | ICD-10-CM | POA: Diagnosis not present

## 2018-11-01 DIAGNOSIS — R300 Dysuria: Secondary | ICD-10-CM | POA: Diagnosis not present

## 2018-11-01 DIAGNOSIS — N390 Urinary tract infection, site not specified: Secondary | ICD-10-CM | POA: Diagnosis not present

## 2018-12-03 DIAGNOSIS — N390 Urinary tract infection, site not specified: Secondary | ICD-10-CM | POA: Diagnosis not present

## 2019-01-11 DIAGNOSIS — S8991XA Unspecified injury of right lower leg, initial encounter: Secondary | ICD-10-CM | POA: Diagnosis not present

## 2019-01-17 DIAGNOSIS — S83281A Other tear of lateral meniscus, current injury, right knee, initial encounter: Secondary | ICD-10-CM | POA: Diagnosis not present

## 2019-01-31 DIAGNOSIS — M25561 Pain in right knee: Secondary | ICD-10-CM | POA: Diagnosis not present

## 2019-02-04 DIAGNOSIS — R062 Wheezing: Secondary | ICD-10-CM | POA: Diagnosis not present

## 2019-02-04 DIAGNOSIS — J019 Acute sinusitis, unspecified: Secondary | ICD-10-CM | POA: Diagnosis not present
